# Patient Record
Sex: Female | Born: 1935 | Race: White | Hispanic: No | Smoking: Never smoker
Health system: Southern US, Community
[De-identification: ages and names within clinical notes are randomized; demographics above are authoritative.]

---

## 2003-09-13 ENCOUNTER — Other Ambulatory Visit: Payer: Self-pay

## 2004-08-05 ENCOUNTER — Emergency Department: Payer: Self-pay | Admitting: Emergency Medicine

## 2004-10-23 ENCOUNTER — Emergency Department: Payer: Self-pay | Admitting: Emergency Medicine

## 2006-08-15 ENCOUNTER — Other Ambulatory Visit: Payer: Self-pay

## 2006-08-15 ENCOUNTER — Inpatient Hospital Stay: Payer: Self-pay | Admitting: Internal Medicine

## 2009-01-15 ENCOUNTER — Inpatient Hospital Stay: Payer: Self-pay | Admitting: Internal Medicine

## 2009-02-06 ENCOUNTER — Emergency Department: Payer: Self-pay | Admitting: Emergency Medicine

## 2009-02-22 ENCOUNTER — Emergency Department: Payer: Self-pay | Admitting: Unknown Physician Specialty

## 2009-02-25 ENCOUNTER — Emergency Department: Payer: Self-pay | Admitting: Unknown Physician Specialty

## 2009-10-13 ENCOUNTER — Inpatient Hospital Stay: Payer: Self-pay | Admitting: Internal Medicine

## 2010-02-10 ENCOUNTER — Inpatient Hospital Stay: Payer: Self-pay | Admitting: Internal Medicine

## 2010-02-16 ENCOUNTER — Ambulatory Visit: Payer: Self-pay | Admitting: Internal Medicine

## 2010-05-07 IMAGING — CR DG CHEST 2V
1 series · 2 of 2 positions shown · non-contrast
Comparison: none

REASON FOR EXAM: pre-syncope
COMMENTS:

PROCEDURE:     DXR - DXR CHEST PA (OR AP) AND LATERAL  - February 07, 2009  [DATE]
RESULT:     Comparison: 01/15/2009

[Series 1: view not recorded · 0.17mm/px · 2 of 2 slices shown]
[im 1/2]
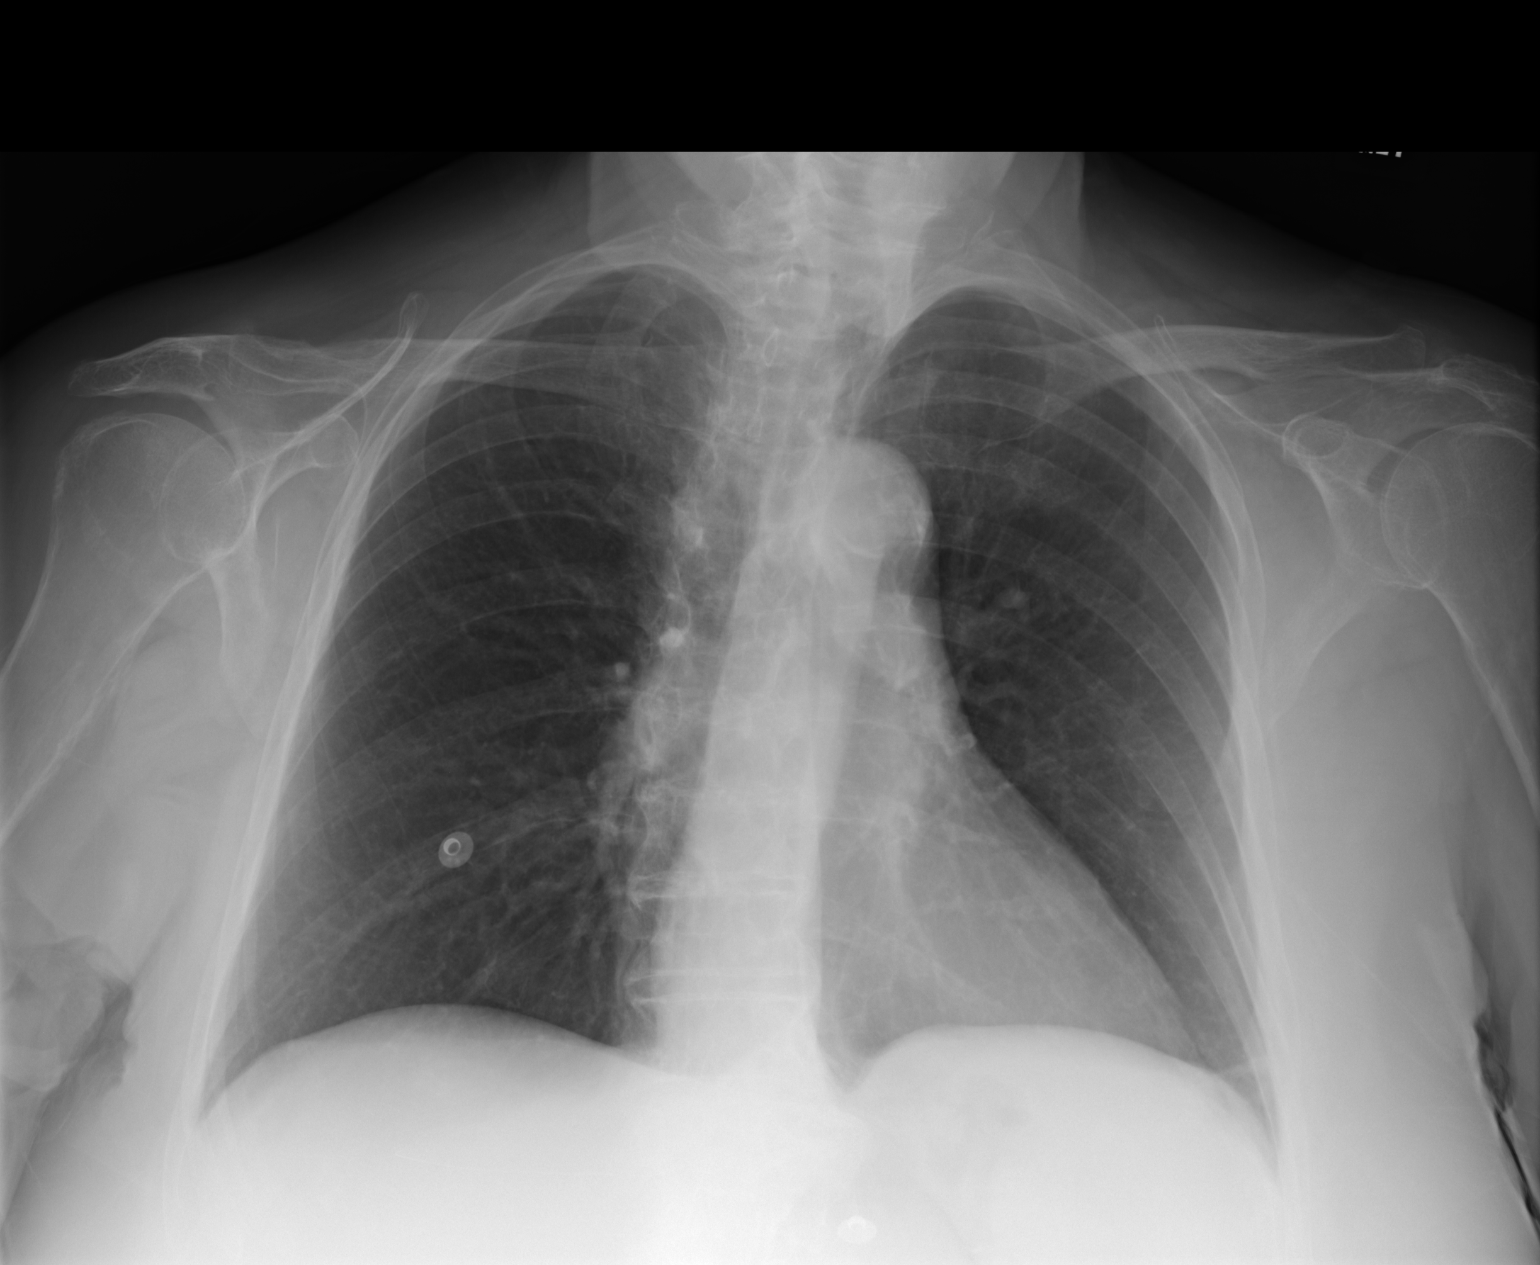
[im 2/2]
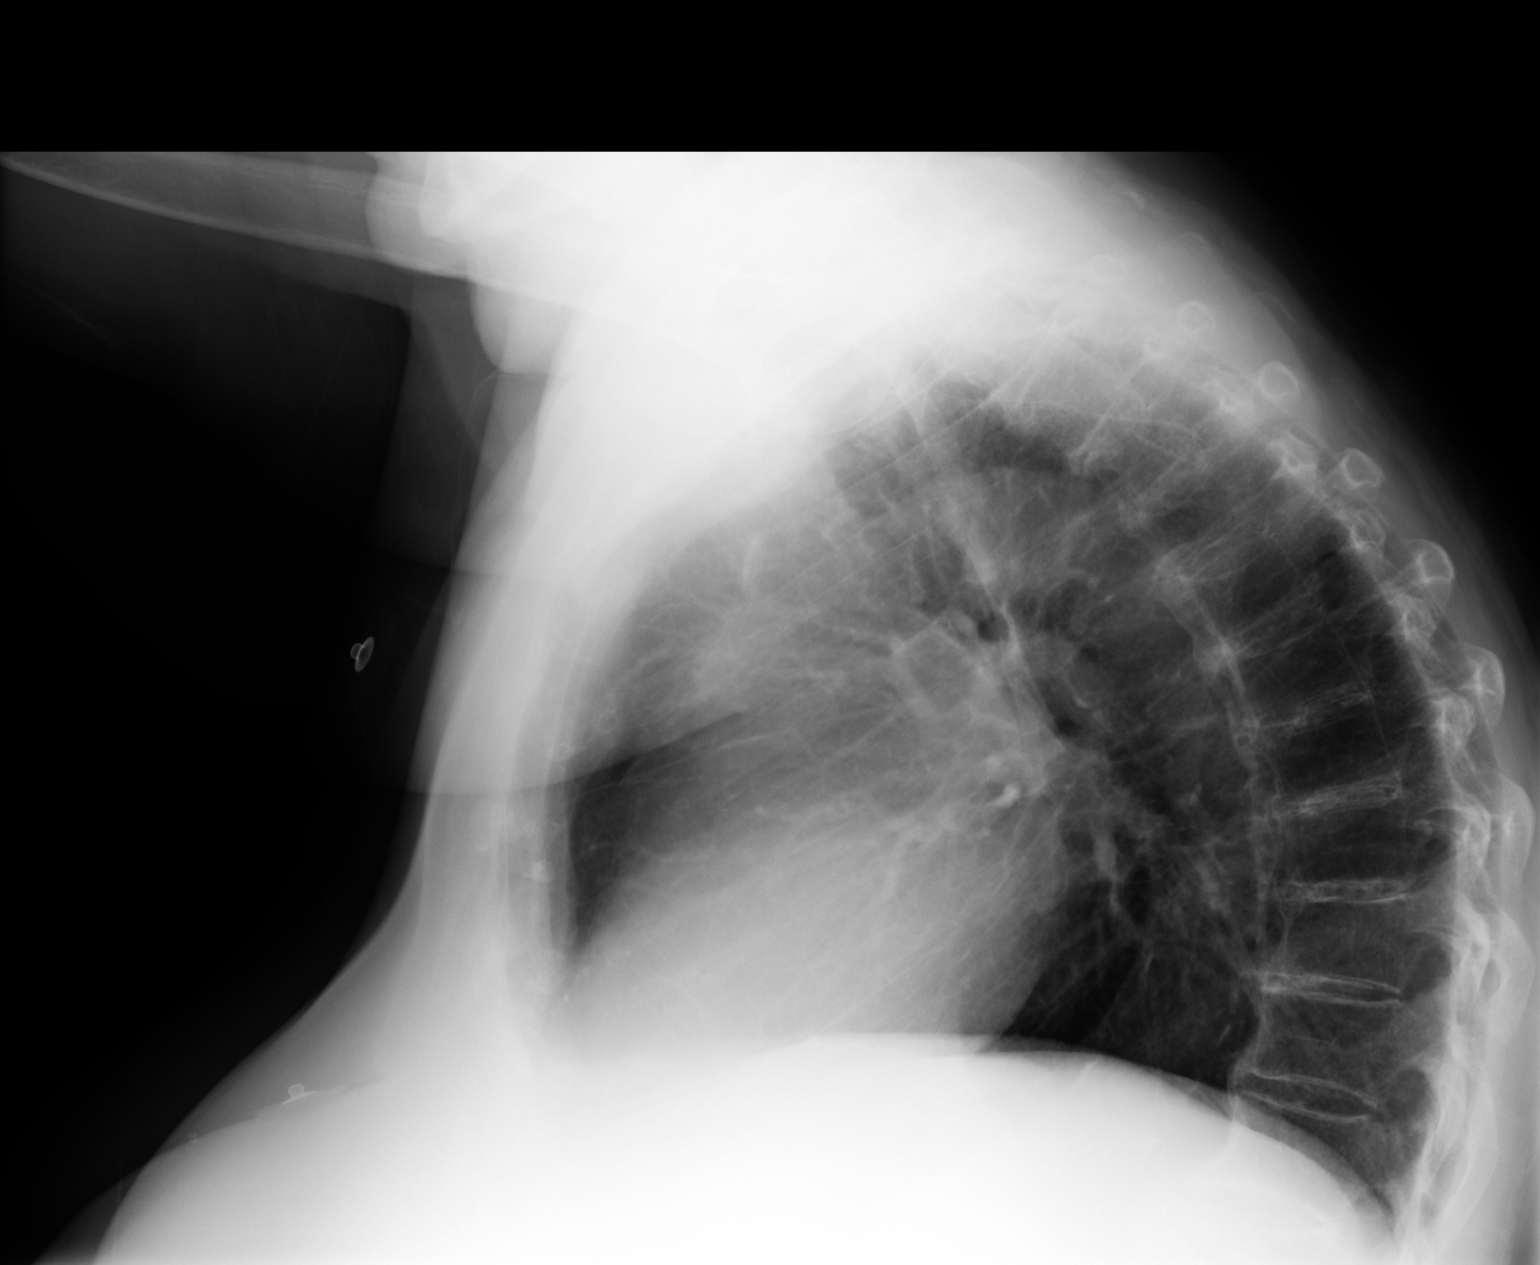

[2 of 2 positions shown; findings below may reference images not displayed]

FINDINGS: PA and lateral chest radiographs are provided. There is no focal parenchymal
opacity, pleural effusion, or pneumothorax. The heart and mediastinum are
unremarkable. The osseous structures are unremarkable.
IMPRESSION: No acute disease of the chest.

## 2010-12-22 ENCOUNTER — Inpatient Hospital Stay: Payer: Self-pay | Admitting: Orthopedic Surgery

## 2016-05-11 ENCOUNTER — Emergency Department: Payer: Medicare Other

## 2016-05-11 ENCOUNTER — Emergency Department
Admission: EM | Admit: 2016-05-11 | Discharge: 2016-05-12 | Disposition: A | Discharge status: Home / Self Care | Payer: Medicare Other | Attending: Emergency Medicine | Admitting: Emergency Medicine

## 2016-05-11 ENCOUNTER — Encounter: Payer: Self-pay | Admitting: Emergency Medicine

## 2016-05-11 DIAGNOSIS — Z79899 Other long term (current) drug therapy: Secondary | ICD-10-CM | POA: Insufficient documentation

## 2016-05-11 DIAGNOSIS — F17290 Nicotine dependence, other tobacco product, uncomplicated: Secondary | ICD-10-CM | POA: Insufficient documentation

## 2016-05-11 DIAGNOSIS — M7989 Other specified soft tissue disorders: Secondary | ICD-10-CM | POA: Diagnosis present

## 2016-05-11 DIAGNOSIS — Z7982 Long term (current) use of aspirin: Secondary | ICD-10-CM | POA: Insufficient documentation

## 2016-05-11 DIAGNOSIS — I82403 Acute embolism and thrombosis of unspecified deep veins of lower extremity, bilateral: Secondary | ICD-10-CM | POA: Diagnosis not present

## 2016-05-11 LAB — CBC WITH DIFFERENTIAL/PLATELET
Basophils Absolute: 0.1 10*3/uL (ref 0–0.1)
Basophils Relative: 1 %
EOS PCT: 3 %
Eosinophils Absolute: 0.3 10*3/uL (ref 0–0.7)
HCT: 30.8 % — ABNORMAL LOW (ref 35.0–47.0)
Hemoglobin: 10.2 g/dL — ABNORMAL LOW (ref 12.0–16.0)
LYMPHS ABS: 1.2 10*3/uL (ref 1.0–3.6)
LYMPHS PCT: 14 %
MCH: 30.7 pg (ref 26.0–34.0)
MCHC: 33.1 g/dL (ref 32.0–36.0)
MCV: 92.8 fL (ref 80.0–100.0)
MONO ABS: 0.5 10*3/uL (ref 0.2–0.9)
MONOS PCT: 6 %
Neutro Abs: 6.5 10*3/uL (ref 1.4–6.5)
Neutrophils Relative %: 76 %
PLATELETS: 229 10*3/uL (ref 150–440)
RBC: 3.31 MIL/uL — AB (ref 3.80–5.20)
RDW: 15.6 % — AB (ref 11.5–14.5)
WBC: 8.6 10*3/uL (ref 3.6–11.0)

## 2016-05-11 LAB — COMPREHENSIVE METABOLIC PANEL
ALT: 9 U/L — AB (ref 14–54)
AST: 15 U/L (ref 15–41)
Albumin: 3 g/dL — ABNORMAL LOW (ref 3.5–5.0)
Alkaline Phosphatase: 76 U/L (ref 38–126)
Anion gap: 7 (ref 5–15)
BUN: 34 mg/dL — ABNORMAL HIGH (ref 6–20)
CHLORIDE: 115 mmol/L — AB (ref 101–111)
CO2: 19 mmol/L — ABNORMAL LOW (ref 22–32)
CREATININE: 1.36 mg/dL — AB (ref 0.44–1.00)
Calcium: 9.1 mg/dL (ref 8.9–10.3)
GFR, EST AFRICAN AMERICAN: 38 mL/min — AB (ref >60)
GFR, EST NON AFRICAN AMERICAN: 33 mL/min — AB (ref >60)
Glucose, Bld: 130 mg/dL — ABNORMAL HIGH (ref 65–99)
Potassium: 3.6 mmol/L (ref 3.5–5.1)
Sodium: 141 mmol/L (ref 135–145)
Total Bilirubin: 0.5 mg/dL (ref 0.3–1.2)
Total Protein: 6.6 g/dL (ref 6.5–8.1)

## 2016-05-11 LAB — TROPONIN I: TROPONIN I: 0.04 ng/mL — AB (ref <0.03)

## 2016-05-11 MED ORDER — ENOXAPARIN SODIUM 100 MG/ML ~~LOC~~ SOLN
90.0000 mg | Freq: Once | SUBCUTANEOUS | Status: AC
  Administered 2016-05-11: 90 mg via SUBCUTANEOUS
  Filled 2016-05-11: qty 1

## 2016-05-11 MED ORDER — HYDROCODONE-ACETAMINOPHEN 5-325 MG PO TABS: 1.0000 | ORAL_TABLET | Freq: Four times a day (QID) | ORAL | 0 refills | Status: AC | PRN

## 2016-05-11 MED ORDER — ENOXAPARIN SODIUM 100 MG/ML ~~LOC~~ SOLN: 90.0000 mg | SUBCUTANEOUS | 0 refills | Status: AC

## 2016-05-11 NOTE — Discharge Instructions (Signed)
You have been seen in the emergency department for bilateral lower leg pain. Your workup shows you have blood clots in both of your legs. Please take your blood thinner via subcutaneous injection once daily. Please follow-up with her primary care doctor within the next 7 days for re-check/reevaluation. Please obtain a repeat ultrasound in the next 2-4 weeks for reevaluation of your blood clots. Return to the emergency department for any chest pain, trouble breathing, black or bloody stool, or any other symptom personally concerning to yourself..Marland Kitchen

## 2016-05-11 NOTE — ED Triage Notes (Signed)
Pt to ED via EMS from [facility], c/o  swelling to bilat lower extremities. Redness, warm to touch, pitting edema noted bilaterally. Pt c/o pain to area. Pt A&Ox4, VS stable.

## 2016-05-11 NOTE — ED Notes (Signed)
Patient transported to Ultrasound 

## 2016-05-11 NOTE — ED Notes (Signed)
Pt updated on delay. Pt verbalizes understanding. Pt denies further needs. Call bell at right side.

## 2016-05-11 NOTE — ED Provider Notes (Addendum)
[facility] Emergency Department Provider Note  Time seen: 3:41 PM  I have reviewed the triage vital signs and the nursing notes.   HISTORY  Chief Complaint Leg Swelling    HPI Tara Mckinney is a 82 y.o. female presents to the emergency department with lower extremity swelling and redness. According to EMS, nursing home staff state increased redness and swelling over the past 2 days. Patient complaining of significant pain to her bilateral lower extremities, left greater than right. EMS states the nursing home gave the patient some pain medication today for this but they are not sure what. Pain medication was given. Patient denies any fever, per her history is quite limited.  History reviewed. No pertinent past medical history.  There are no active problems to display for this patient.   History reviewed. No pertinent surgical history.  Prior to Admission medications   Not on File    No Known Allergies  No family history on file.  Social History Social History  Substance Use Topics  . Smoking status: Never Smoker  . Smokeless tobacco: Current User    Types: Snuff  . Alcohol use No    Review of Systems Constitutional: Negative for fever. Cardiovascular: Negative for chest pain. Respiratory: Negative for shortness of breath. Gastrointestinal: Negative for abdominal pain Musculoskeletal: Swelling to bilateral lower legs, discomfort to bilateral lower legs. Skin: Redness to bilateral lower legs 10-point ROS otherwise negative.  ____________________________________________   PHYSICAL EXAM:  VITAL SIGNS: ED Triage Vitals  Enc Vitals Group     BP 05/11/16 1524 (!) 182/89     Pulse Rate 05/11/16 1524 80     Resp 05/11/16 1524 20     Temp 05/11/16 1535 98.4 F (36.9 C)     Temp Source 05/11/16 1535 Oral     SpO2 05/11/16 1524 96 %     Weight --      Height --      Head Circumference --      Peak Flow --      Pain Score 05/11/16 1524  8     Pain Loc --      Pain Edu? --      Excl. in GC? --     Constitutional: Alert. Well appearing and in no distress. Eyes: Normal exam ENT   Head: Normocephalic and atraumatic.   Mouth/Throat: Mucous membranes are moist. Cardiovascular: Normal rate, regular rhythm. No murmur Respiratory: Normal respiratory effort without tachypnea nor retractions. Breath sounds are clear  Gastrointestinal: Soft and nontender. No distention.  Musculoskeletal: Moderate lower extremity edema and erythema bilaterally, left somewhat greater than right. 2+ pitting. Tender to palpation. Neurovascularly intact. Neurologic:  Normal speech and language. No gross focal neurologic deficits  Skin:  Skin is warm, dry and intact. Erythematous to bilateral lower extremities. Psychiatric: Mood and affect are normal. Speech and behavior are normal.   ____________________________________________    EKG  EKG reviewed and interpreted by myself shows normal sinus rhythm at 85 bpm, narrow QRS, normal axis, Larson normal intervals with nonspecific ST changes without ST elevation.  ____________________________________________    RADIOLOGY  Present positive for bilateral DVT.  ____________________________________________   INITIAL IMPRESSION / ASSESSMENT AND PLAN / ED COURSE  Pertinent labs & imaging results that were available during my care of the patient were reviewed by me and considered in my medical decision making (see chart for details).  The patient presents the emergency department for lower extremity edema and erythema most consistent with  cellulitis. We will check labs, ultrasounds, and close monitoring in the emergency department.  Ultrasound positive for bilateral DVT. Labs are largely within normal limits including normal white blood cell count. 1 and slightly elevated likely baseline. Patient has no chest pain. Vitals are largely within normal limits including a normal heart rate in the 70s.  We will start the patient on xarelto, we will discontinue her Plavix. Patient's son is here to states the patient is completely nonambulatory no history of GI or intracranial bleed. Patient is wheelchair bound with help at her nursing facility.  I discussed the patient with pharmacy who recommend given her creatinine clearance doing a 1 mg/kg per day Lovenox injection. We have verified with [facility] that they're able to do this. Patient will receive Lovenox in the emergency department with a prescription of Lovenox going forward until she can be seen by her primary care doctor for further treatment.  ____________________________________________   FINAL CLINICAL IMPRESSION(S) / ED DIAGNOSES  Bilateral DVT    Minna AntisKevin Joquan Lotz, MD 05/11/16 1850    Minna AntisKevin Fredis Malkiewicz, MD 05/11/16 1906

## 2016-05-11 NOTE — ED Notes (Signed)
Family at bedside at this time, updated at this time about pt condition.

## 2016-05-11 NOTE — ED Notes (Addendum)
[facility] contacted x4 with no answer, will try again

## 2016-05-12 NOTE — ED Notes (Signed)
Pt denies needs, pt updated on delay. Pt verbalizes understanding.

## 2016-05-12 NOTE — ED Notes (Signed)
Ems took this pt without notifying this rn. No discharge vital signs obtained for this reason. No pain assessment performed on discharge for this reason. Pt had verbalized no pain before at 0000.

## 2018-05-19 DEATH — deceased
